# Patient Record
Sex: Female | Born: 1975 | Race: White | Hispanic: No | Marital: Single | Smoking: Current every day smoker
Health system: Southern US, Community
[De-identification: ages and names within clinical notes are randomized; demographics above are authoritative.]

## PROBLEM LIST (undated history)

## (undated) DIAGNOSIS — K219 Gastro-esophageal reflux disease without esophagitis: Secondary | ICD-10-CM

## (undated) DIAGNOSIS — J45909 Unspecified asthma, uncomplicated: Secondary | ICD-10-CM

## (undated) DIAGNOSIS — M549 Dorsalgia, unspecified: Secondary | ICD-10-CM

## (undated) HISTORY — PX: KNEE SURGERY: SHX244

---

## 2014-11-07 ENCOUNTER — Emergency Department: Payer: Medicare Other

## 2014-11-07 ENCOUNTER — Emergency Department
Admission: EM | Admit: 2014-11-07 | Discharge: 2014-11-07 | Disposition: A | Discharge status: Home / Self Care | Payer: Medicare Other | Attending: Emergency Medicine | Admitting: Emergency Medicine

## 2014-11-07 ENCOUNTER — Encounter: Payer: Self-pay | Admitting: Emergency Medicine

## 2014-11-07 DIAGNOSIS — W1789XA Other fall from one level to another, initial encounter: Secondary | ICD-10-CM | POA: Insufficient documentation

## 2014-11-07 DIAGNOSIS — Z720 Tobacco use: Secondary | ICD-10-CM | POA: Insufficient documentation

## 2014-11-07 DIAGNOSIS — Z791 Long term (current) use of non-steroidal anti-inflammatories (NSAID): Secondary | ICD-10-CM | POA: Diagnosis not present

## 2014-11-07 DIAGNOSIS — S39012A Strain of muscle, fascia and tendon of lower back, initial encounter: Secondary | ICD-10-CM | POA: Diagnosis not present

## 2014-11-07 DIAGNOSIS — S86912A Strain of unspecified muscle(s) and tendon(s) at lower leg level, left leg, initial encounter: Secondary | ICD-10-CM | POA: Diagnosis not present

## 2014-11-07 DIAGNOSIS — Y9389 Activity, other specified: Secondary | ICD-10-CM | POA: Diagnosis not present

## 2014-11-07 DIAGNOSIS — Y9289 Other specified places as the place of occurrence of the external cause: Secondary | ICD-10-CM | POA: Insufficient documentation

## 2014-11-07 DIAGNOSIS — S3992XA Unspecified injury of lower back, initial encounter: Secondary | ICD-10-CM | POA: Diagnosis present

## 2014-11-07 DIAGNOSIS — Y998 Other external cause status: Secondary | ICD-10-CM | POA: Diagnosis not present

## 2014-11-07 DIAGNOSIS — W102XXA Fall (on)(from) incline, initial encounter: Secondary | ICD-10-CM

## 2014-11-07 HISTORY — DX: Unspecified asthma, uncomplicated: J45.909

## 2014-11-07 HISTORY — DX: Dorsalgia, unspecified: M54.9

## 2014-11-07 HISTORY — DX: Gastro-esophageal reflux disease without esophagitis: K21.9

## 2014-11-07 MED ORDER — IPRATROPIUM-ALBUTEROL 0.5-2.5 (3) MG/3ML IN SOLN
3.0000 mL | Freq: Once | RESPIRATORY_TRACT | Status: AC
  Administered 2014-11-07: 3 mL via RESPIRATORY_TRACT
  Filled 2014-11-07: qty 3

## 2014-11-07 MED ORDER — OXYCODONE-ACETAMINOPHEN 5-325 MG PO TABS: 1.0000 | ORAL_TABLET | ORAL | Status: AC | PRN

## 2014-11-07 MED ORDER — GABAPENTIN 100 MG PO CAPS: 100.0000 mg | ORAL_CAPSULE | Freq: Every day | ORAL | Status: DC

## 2014-11-07 MED ORDER — KETOROLAC TROMETHAMINE 60 MG/2ML IM SOLN
60.0000 mg | Freq: Once | INTRAMUSCULAR | Status: AC
  Administered 2014-11-07: 60 mg via INTRAMUSCULAR

## 2014-11-07 MED ORDER — DIAZEPAM 5 MG/ML IJ SOLN
5.0000 mg | Freq: Once | INTRAMUSCULAR | Status: AC
  Administered 2014-11-07: 5 mg via INTRAMUSCULAR

## 2014-11-07 MED ORDER — MELOXICAM 7.5 MG PO TABS: 7.5000 mg | ORAL_TABLET | Freq: Every day | ORAL | Status: DC

## 2014-11-07 MED ORDER — KETOROLAC TROMETHAMINE 60 MG/2ML IM SOLN
INTRAMUSCULAR | Status: AC
  Administered 2014-11-07: 60 mg via INTRAMUSCULAR
  Filled 2014-11-07: qty 2

## 2014-11-07 MED ORDER — PREDNISONE 10 MG PO TABS: 50.0000 mg | ORAL_TABLET | Freq: Every day | ORAL | Status: AC

## 2014-11-07 MED ORDER — METHOCARBAMOL 500 MG PO TABS: 500.0000 mg | ORAL_TABLET | Freq: Four times a day (QID) | ORAL | Status: DC | PRN

## 2014-11-07 MED ORDER — DIAZEPAM 5 MG/ML IJ SOLN
INTRAMUSCULAR | Status: AC
  Administered 2014-11-07: 5 mg via INTRAMUSCULAR
  Filled 2014-11-07: qty 2

## 2014-11-07 MED ORDER — METHOCARBAMOL 500 MG PO TABS: 500.0000 mg | ORAL_TABLET | Freq: Four times a day (QID) | ORAL | Status: AC | PRN

## 2014-11-07 NOTE — Discharge Instructions (Signed)
Back Pain, Adult Back pain is very common. The pain often gets better over time. The cause of back pain is usually not dangerous. Most people can learn to manage their back pain on their own.  HOME CARE   Stay active. Start with short walks on flat ground if you can. Try to walk farther each day.  Do not sit, drive, or stand in one place for more than 30 minutes. Do not stay in bed.  Do not avoid exercise or work. Activity can help your back heal faster.  Be careful when you bend or lift an object. Bend at your knees, keep the object close to you, and do not twist.  Sleep on a firm mattress. Lie on your side, and bend your knees. If you lie on your back, put a pillow under your knees.  Only take medicines as told by your doctor.  Put ice on the injured area.  Put ice in a plastic bag.  Place a towel between your skin and the bag.  Leave the ice on for 15-20 minutes, 03-04 times a day for the first 2 to 3 days. After that, you can switch between ice and heat packs.  Ask your doctor about back exercises or massage.  Avoid feeling anxious or stressed. Find good ways to deal with stress, such as exercise. GET HELP RIGHT AWAY IF:   Your pain does not go away with rest or medicine.  Your pain does not go away in 1 week.  You have new problems.  You do not feel well.  The pain spreads into your legs.  You cannot control when you poop (bowel movement) or pee (urinate).  Your arms or legs feel weak or lose feeling (numbness).  You feel sick to your stomach (nauseous) or throw up (vomit).  You have belly (abdominal) pain.  You feel like you may pass out (faint). MAKE SURE YOU:   Understand these instructions.  Will watch your condition.  Will get help right away if you are not doing well or get worse. Document Released: 08/30/2007 Document Revised: 06/05/2011 Document Reviewed: 07/15/2013 [facility] Patient Information 2015 [place], [place]. This information is not intended  to replace advice given to you by your health care provider. Make sure you discuss any questions you have with your health care provider.  Knee Sprain A knee sprain is a tear in one of the strong, fibrous tissues that connect the bones (ligaments) in your knee. The severity of the sprain depends on how much of the ligament is torn. The tear can be either partial or complete. CAUSES  Often, sprains are a result of a fall or injury. The force of the impact causes the fibers of your ligament to stretch too much. This excess tension causes the fibers of your ligament to tear. SIGNS AND SYMPTOMS  You may have some loss of motion in your knee. Other symptoms include:  Bruising.  Pain in the knee area.  Tenderness of the knee to the touch.  Swelling. DIAGNOSIS  To diagnose a knee sprain, your health care provider will physically examine your knee. Your health care provider may also suggest an X-ray exam of your knee to make sure no bones are broken. TREATMENT  If your ligament is only partially torn, treatment usually involves keeping the knee in a fixed position (immobilization) or bracing your knee for activities that require movement for several weeks. To do this, your health care provider will apply a bandage, cast, or splint to  keep your knee from moving and to support your knee during movement until it heals. For a partially torn ligament, the healing process usually takes 4-6 weeks. If your ligament is completely torn, depending on which ligament it is, you may need surgery to reconnect the ligament to the bone or reconstruct it. After surgery, a cast or splint may be applied and will need to stay on your knee for 4-6 weeks while your ligament heals. HOME CARE INSTRUCTIONS  Keep your injured knee elevated to decrease swelling.  To ease pain and swelling, apply ice to the injured area:  Put ice in a plastic bag.  Place a towel between your skin and the bag.  Leave the ice on for 20  minutes, 2-3 times a day.  Only take medicine for pain as directed by your health care provider.  Do not leave your knee unprotected until pain and stiffness go away (usually 4-6 weeks).  If you have a cast or splint, do not allow it to get wet. If you have been instructed not to remove it, cover it with a plastic bag when you shower or bathe. Do not swim.  Your health care provider may suggest exercises for you to do during your recovery to prevent or limit permanent weakness and stiffness. SEEK IMMEDIATE MEDICAL CARE IF:  Your cast or splint becomes damaged.  Your pain becomes worse.  You have significant pain, swelling, or numbness below the cast or splint. MAKE SURE YOU:  Understand these instructions.  Will watch your condition.  Will get help right away if you are not doing well or get worse. Document Released: 03/13/2005 Document Revised: 01/01/2013 Document Reviewed: 10/23/2012 [facility] Patient Information 2015 [place], [place]. This information is not intended to replace advice given to you by your health care provider. Make sure you discuss any questions you have with your health care provider.

## 2014-11-07 NOTE — ED Provider Notes (Signed)
[facility] Emergency Department Provider Note  ____________________________________________  Time seen: Approximately 5:59 PM  I have reviewed the triage vital signs and the nursing notes.   HISTORY  Chief Complaint Fall    HPI Tamara Coleman is a 39 y.o. female patient states she is moving couple hours ago fell out of the passenger side of the [employer] truck got her foot tangled up. Complains of low back pain and left knee pain. States no relief with all 100 mg Flexeril 5 mg or gabapentin. As onset for 3-4 hours now.    Past Medical History  Diagnosis Date  . Asthma   . Back pain   . GERD (gastroesophageal reflux disease)     There are no active problems to display for this patient.   Past Surgical History  Procedure Laterality Date  . Knee surgery      Current Outpatient Rx  Name  Route  Sig  Dispense  Refill  . meloxicam (MOBIC) 7.5 MG tablet   Oral   Take 1 tablet (7.5 mg total) by mouth daily.   30 tablet   0   . methocarbamol (ROBAXIN) 500 MG tablet   Oral   Take 1 tablet (500 mg total) by mouth every 6 (six) hours as needed for muscle spasms.   30 tablet   0     Allergies Vicodin  History reviewed. No pertinent family history.  Social History Social History  Substance Use Topics  . Smoking status: Current Every Day Smoker  . Smokeless tobacco: None  . Alcohol Use: No    Review of Systems Constitutional: No fever/chills Eyes: No visual changes. ENT: No sore throat. Cardiovascular: Denies chest pain. Respiratory: Denies shortness of breath. Gastrointestinal: No abdominal pain.  No nausea, no vomiting.  No diarrhea.  No constipation. Genitourinary: Negative for dysuria. Musculoskeletal: Positive for low back pain and left knee pain. Skin: Negative for rash. Neurological: Negative for headaches, focal weakness or numbness.  10-point ROS otherwise negative.  ____________________________________________   PHYSICAL  EXAM:  VITAL SIGNS: ED Triage Vitals  Enc Vitals Group     BP 11/07/14 1708 157/92 mmHg     Pulse Rate 11/07/14 1708 99     Resp 11/07/14 1708 20     Temp 11/07/14 1708 98.6 F (37 C)     Temp Source 11/07/14 1708 Oral     SpO2 11/07/14 1708 97 %     Weight 11/07/14 1708 250 lb (113.399 kg)     Height 11/07/14 1708 5\' 6"  (1.676 m)     Head Cir --      Peak Flow --      Pain Score 11/07/14 1709 7     Pain Loc --      Pain Edu? --      Excl. in GC? --     Constitutional: Alert and oriented. Well appearing and in no acute distress. Cardiovascular: Normal rate, regular rhythm. Grossly normal heart sounds.  Good peripheral circulation. Respiratory: Normal respiratory effort.  No retractions. Lungs CTAB. Musculoskeletal: No lower extremity tenderness nor edema.  No joint effusions. Straight leg positive on the left side at approximately 40. Right side is unremarkable point tenderness to lumbar spine. Point tenderness to the left knee medially. With flexion noted 60. Neurologic:  Normal speech and language. No gross focal neurologic deficits are appreciated. No gait instability. Skin:  Skin is warm, dry and intact. No rash noted. Psychiatric: Mood and affect are normal. Speech and behavior are  normal.  ____________________________________________   LABS (all labs ordered are listed, but only abnormal results are displayed)  Labs Reviewed - No data to display ____________________________________________   RADIOLOGY  Lumbar spine and left knee x-rays interpreted by radiologist reviewed myself. Negative for acute fracture or dislocation. ____________________________________________   PROCEDURES  Procedure(s) performed: None  Critical Care performed: No  ____________________________________________   INITIAL IMPRESSION / ASSESSMENT AND PLAN / ED COURSE  Pertinent labs & imaging results that were available during my care of the patient were reviewed by me and considered  in my medical decision making (see chart for details).  Status post fall from vehicle lumbar strain and left knee strain. Rx given for prednisone 50 mg daily for 5 days for acute exacerbation of inflammation. Robaxin 500 mg 4 times a day, and #8 tablets of Percocet 07/28/2023. Patient follow-up with PCP or return to the ER to get any worsening symptomology. Review of the [place] controlled substance registry was accomplished prior to prescribing. ____________________________________________   FINAL CLINICAL IMPRESSION(S) / ED DIAGNOSES  Final diagnoses:  Fall (on)(from) incline, initial encounter  Lumbar strain, initial encounter  Knee strain, left, initial encounter      Evangeline Dakin, PA-C 11/07/14 1936  Evangeline Dakin, PA-C 11/07/14 [id number]  Governor Rooks, MD 11/07/14 2236

## 2014-11-07 NOTE — ED Notes (Signed)
Reports tripping on a [employer] and fell.  Pain to lower back and left knee.  Ambulates well.

## 2014-11-07 NOTE — ED Notes (Signed)
Pt states she was moving and was walking out of the [employer] and fell, pt [id number] pt states nothing is working.

## 2016-03-06 IMAGING — CR DG LUMBAR SPINE 2-3V
3 series · 3 of 3 positions shown · non-contrast
Comparison: None.

CLINICAL DATA: Low back pain. Fall from a [employer] truck. Initial
encounter.

EXAM:
LUMBAR SPINE - 2-3 VIEW

[l-spine ap]
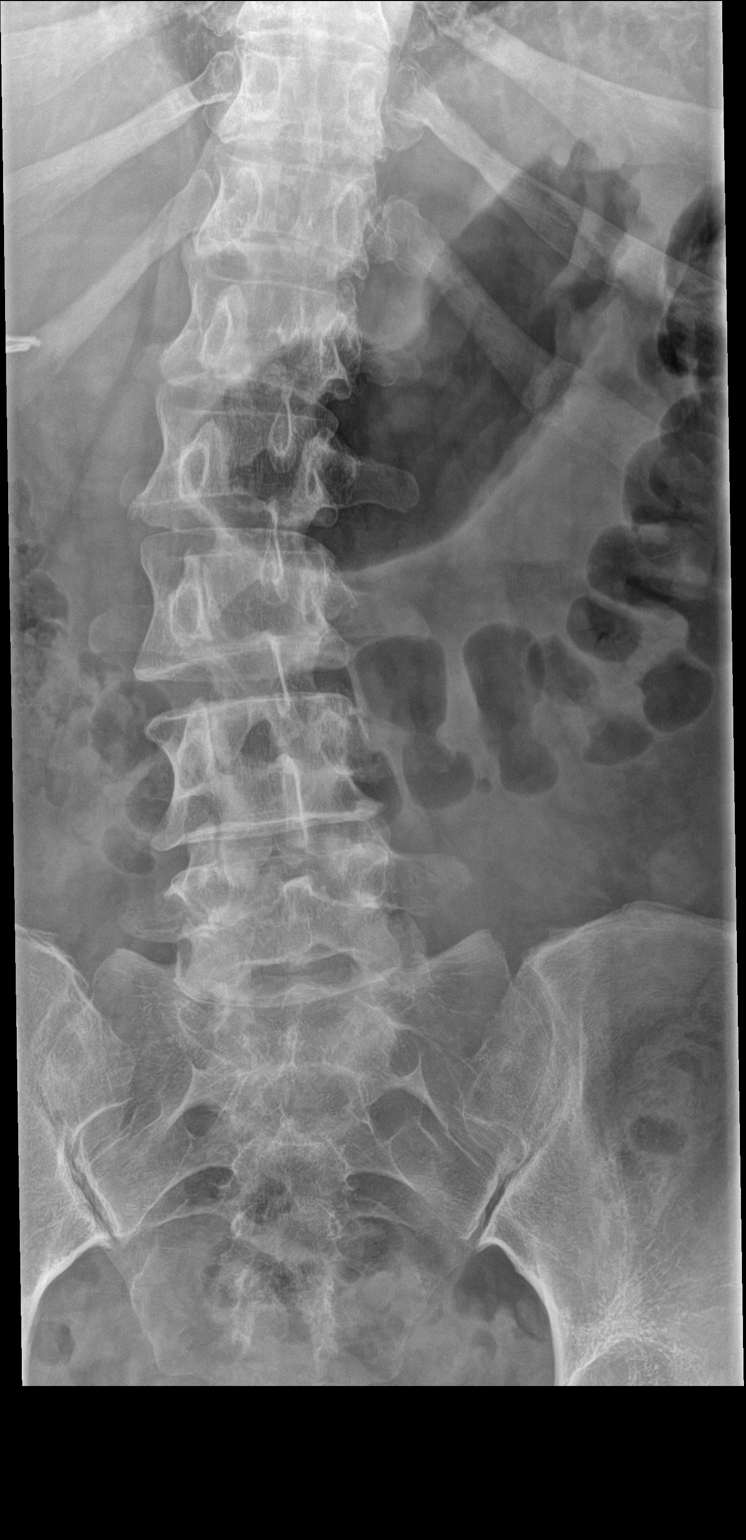

[l-spine lat]
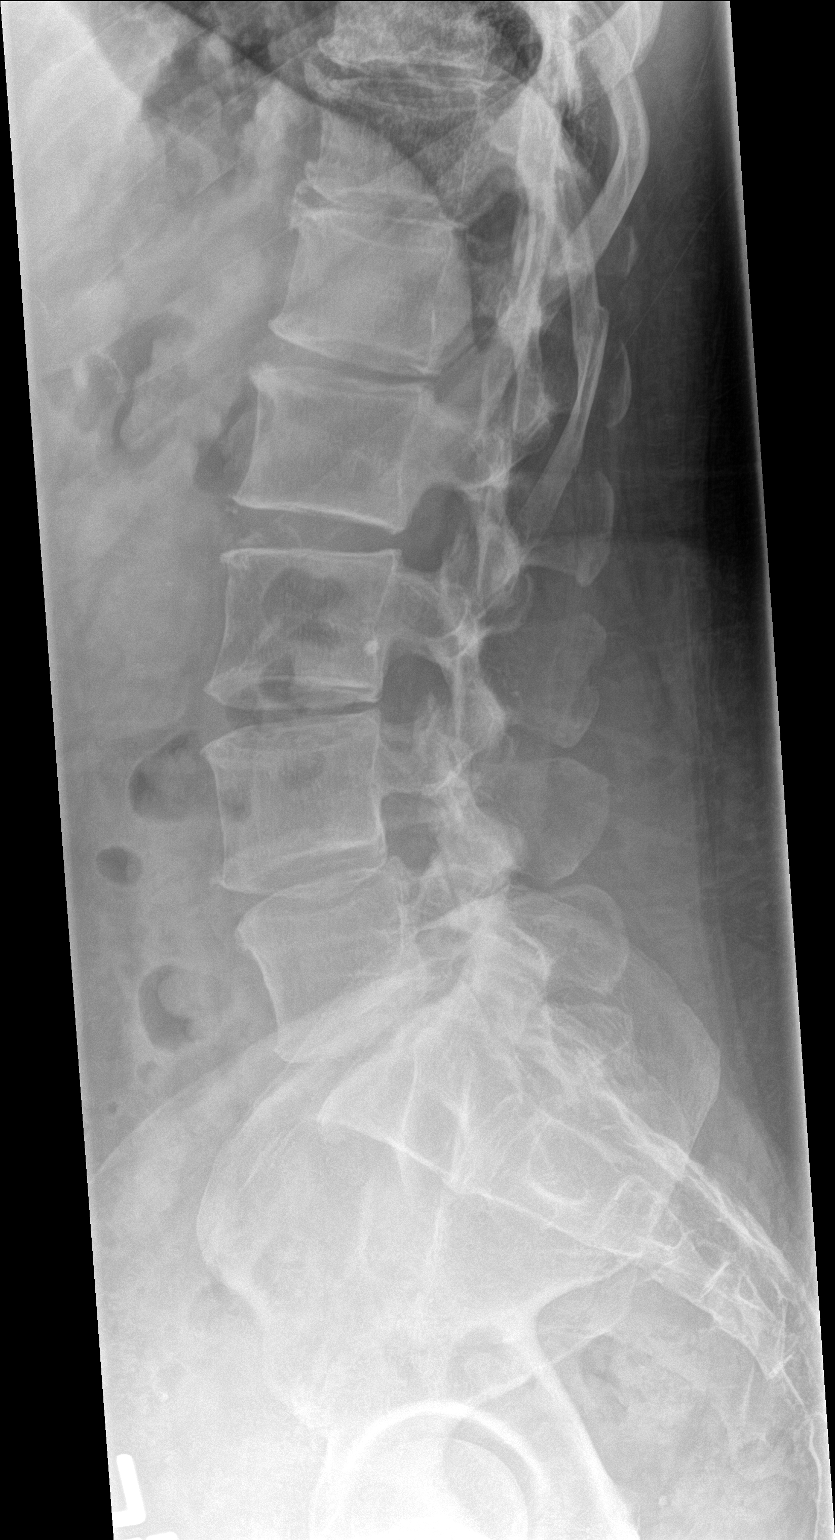

[l-spine spot]
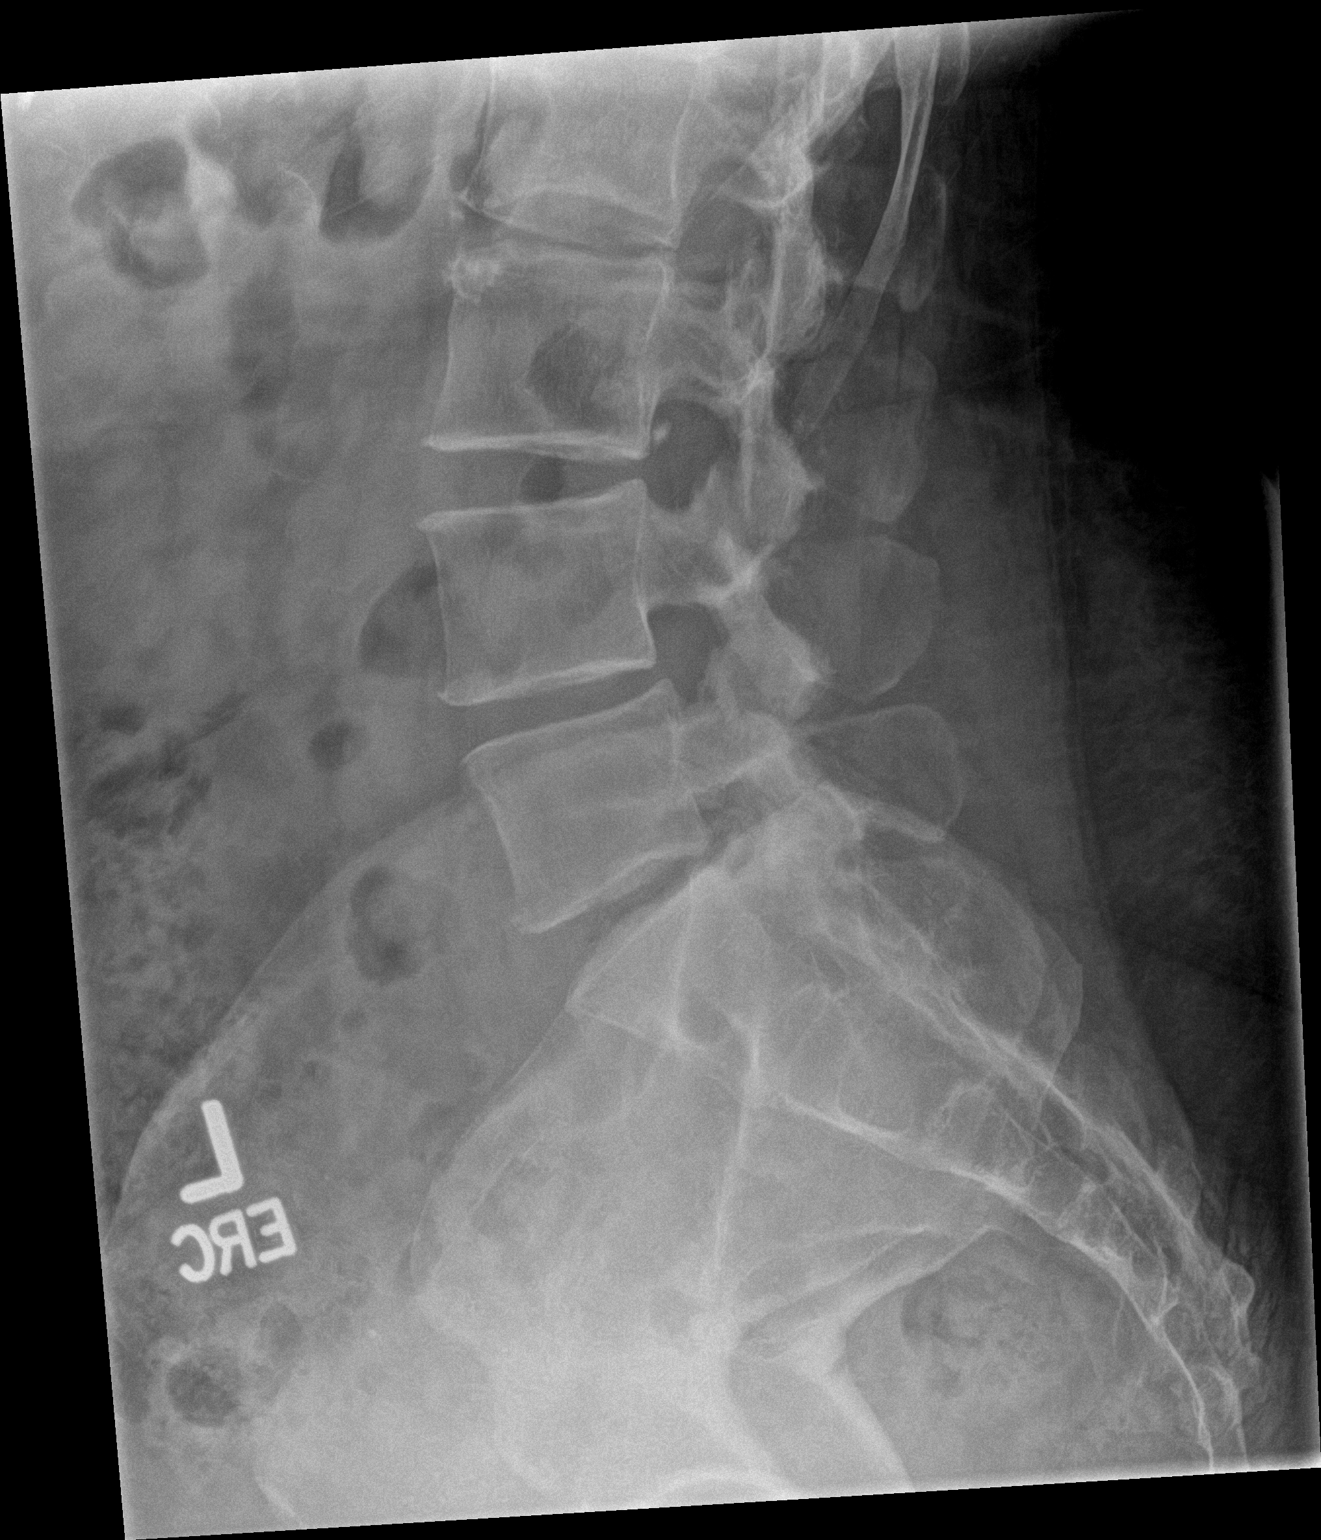

[3 of 3 positions shown; findings below may reference images not displayed]

FINDINGS: Five non rib-bearing lumbar type vertebral bodies are present.
Rightward curvature lumbar spine is centered at L3. Vertebral body
heights are maintained. AP alignment is anatomic. No acute fracture
or traumatic subluxation is present. Chronic loss of disc height is
noted at L5-S1.
IMPRESSION: 1. No acute abnormality.
2. Rightward curvature of the lumbar spine is centered at L3.
3. Chronic loss of disc height and degenerative disc disease at
L5-S1.
